# Patient Record
Sex: Female | Born: 1955 | Race: White | Hispanic: No | Marital: Married | Smoking: Never smoker
Health system: Southern US, Community
[De-identification: ages and names within clinical notes are randomized; demographics above are authoritative.]

## PROBLEM LIST (undated history)

## (undated) DIAGNOSIS — I10 Essential (primary) hypertension: Secondary | ICD-10-CM

## (undated) DIAGNOSIS — E7800 Pure hypercholesterolemia, unspecified: Secondary | ICD-10-CM

## (undated) HISTORY — PX: TONSILLECTOMY: SUR1361

## (undated) HISTORY — PX: DILITATION & CURRETTAGE/HYSTROSCOPY WITH ESSURE: SHX5573

## (undated) HISTORY — PX: EYE SURGERY: SHX253

---

## 2002-07-14 ENCOUNTER — Other Ambulatory Visit: Admission: RE | Admit: 2002-07-14 | Discharge: 2002-07-14 | Payer: Self-pay | Admitting: Obstetrics and Gynecology

## 2002-07-18 ENCOUNTER — Encounter: Payer: Self-pay | Admitting: Obstetrics and Gynecology

## 2002-07-18 ENCOUNTER — Encounter: Admission: RE | Admit: 2002-07-18 | Discharge: 2002-07-18 | Payer: Self-pay | Admitting: Obstetrics and Gynecology

## 2003-07-24 ENCOUNTER — Other Ambulatory Visit: Admission: RE | Admit: 2003-07-24 | Discharge: 2003-07-24 | Payer: Self-pay | Admitting: Obstetrics and Gynecology

## 2004-02-08 ENCOUNTER — Encounter: Admission: RE | Admit: 2004-02-08 | Discharge: 2004-02-08 | Payer: Self-pay | Admitting: Internal Medicine

## 2006-04-12 ENCOUNTER — Encounter: Admission: RE | Admit: 2006-04-12 | Discharge: 2006-04-12 | Payer: Self-pay | Admitting: Obstetrics and Gynecology

## 2006-04-25 ENCOUNTER — Encounter: Admission: RE | Admit: 2006-04-25 | Discharge: 2006-04-25 | Payer: Self-pay | Admitting: Obstetrics and Gynecology

## 2006-04-30 ENCOUNTER — Other Ambulatory Visit: Admission: RE | Admit: 2006-04-30 | Discharge: 2006-04-30 | Payer: Self-pay | Admitting: Obstetrics & Gynecology

## 2008-09-17 ENCOUNTER — Other Ambulatory Visit: Admission: RE | Admit: 2008-09-17 | Discharge: 2008-09-17 | Payer: Self-pay | Admitting: Obstetrics and Gynecology

## 2008-09-22 ENCOUNTER — Encounter: Admission: RE | Admit: 2008-09-22 | Discharge: 2008-09-22 | Payer: Self-pay | Admitting: Obstetrics and Gynecology

## 2008-09-28 ENCOUNTER — Encounter: Admission: RE | Admit: 2008-09-28 | Discharge: 2008-09-28 | Payer: Self-pay | Admitting: Family Medicine

## 2008-10-08 ENCOUNTER — Ambulatory Visit: Payer: Self-pay

## 2008-10-08 ENCOUNTER — Encounter: Payer: Self-pay | Admitting: Internal Medicine

## 2008-10-26 ENCOUNTER — Ambulatory Visit: Payer: Self-pay

## 2008-11-10 ENCOUNTER — Encounter: Admission: RE | Admit: 2008-11-10 | Discharge: 2008-11-10 | Payer: Self-pay | Admitting: Obstetrics and Gynecology

## 2010-07-31 ENCOUNTER — Encounter: Payer: Self-pay | Admitting: Obstetrics and Gynecology

## 2010-07-31 ENCOUNTER — Encounter: Payer: Self-pay | Admitting: Family Medicine

## 2010-08-01 ENCOUNTER — Encounter: Payer: Self-pay | Admitting: Obstetrics and Gynecology

## 2011-01-01 IMAGING — MG MM DIAGNOSTIC LTD LEFT
4 series · 4 of 4 positions shown · non-contrast
Comparison: Prior studies

CLINICAL DATA: Abnormal screening mammogram.

DIGITAL DIAGNOSTIC LEFT BREAST MAMMOGRAM

[L CC (1 of 2)]
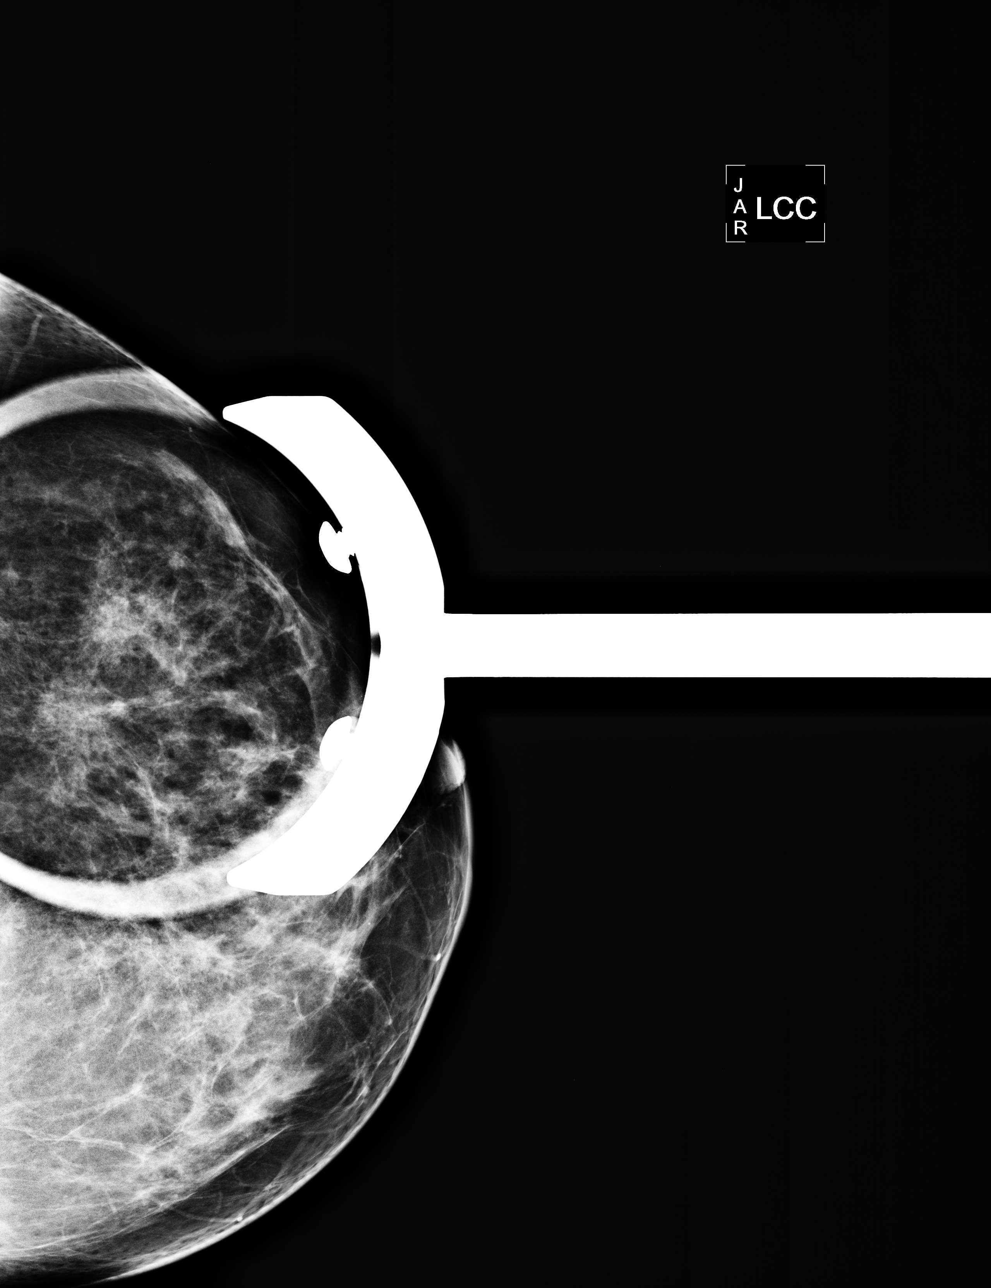

[L MLO (1 of 2)]
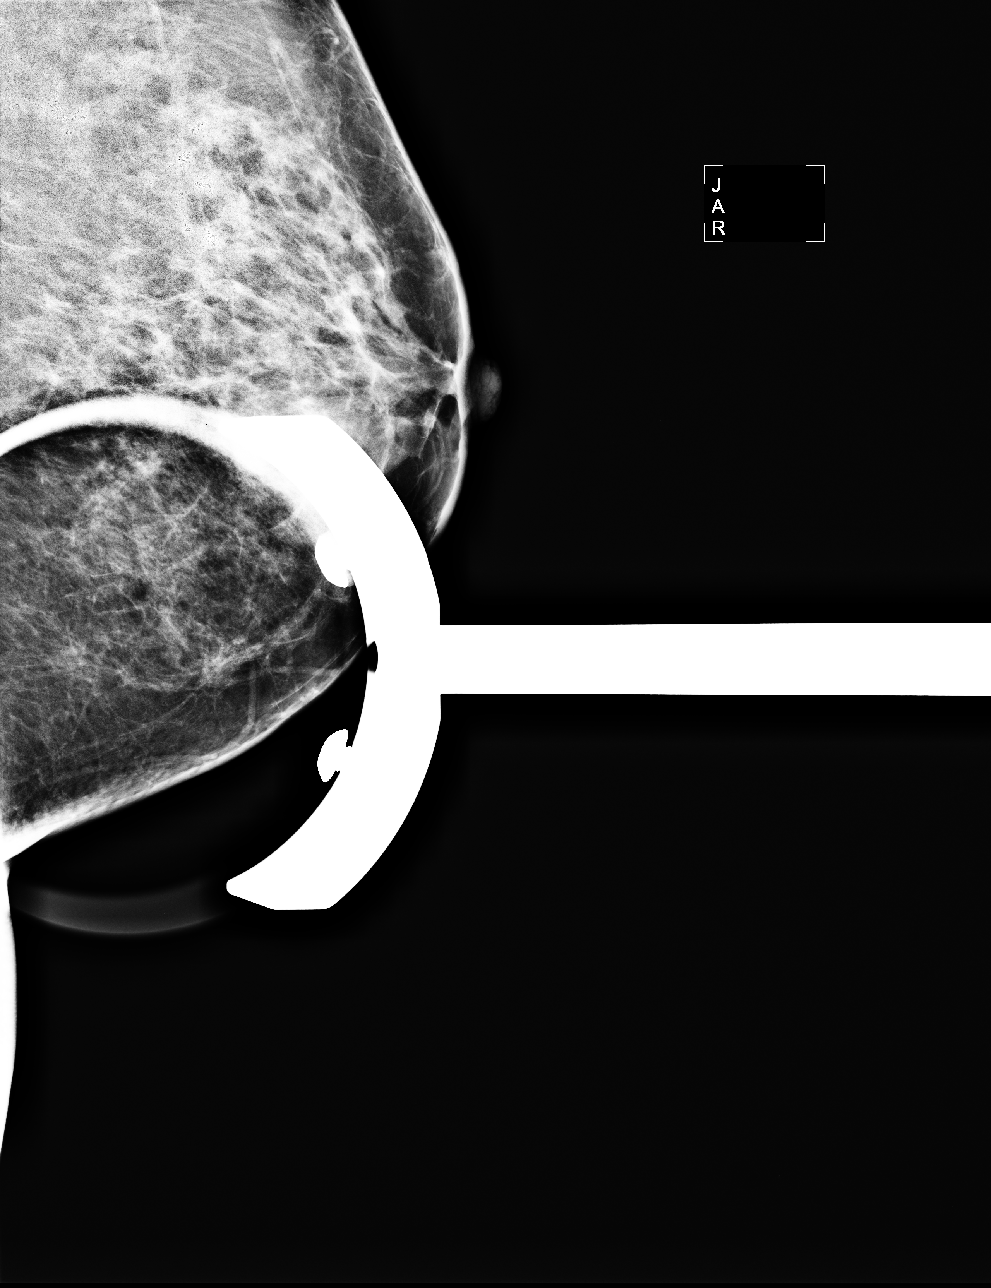

[L CC (2 of 2)]
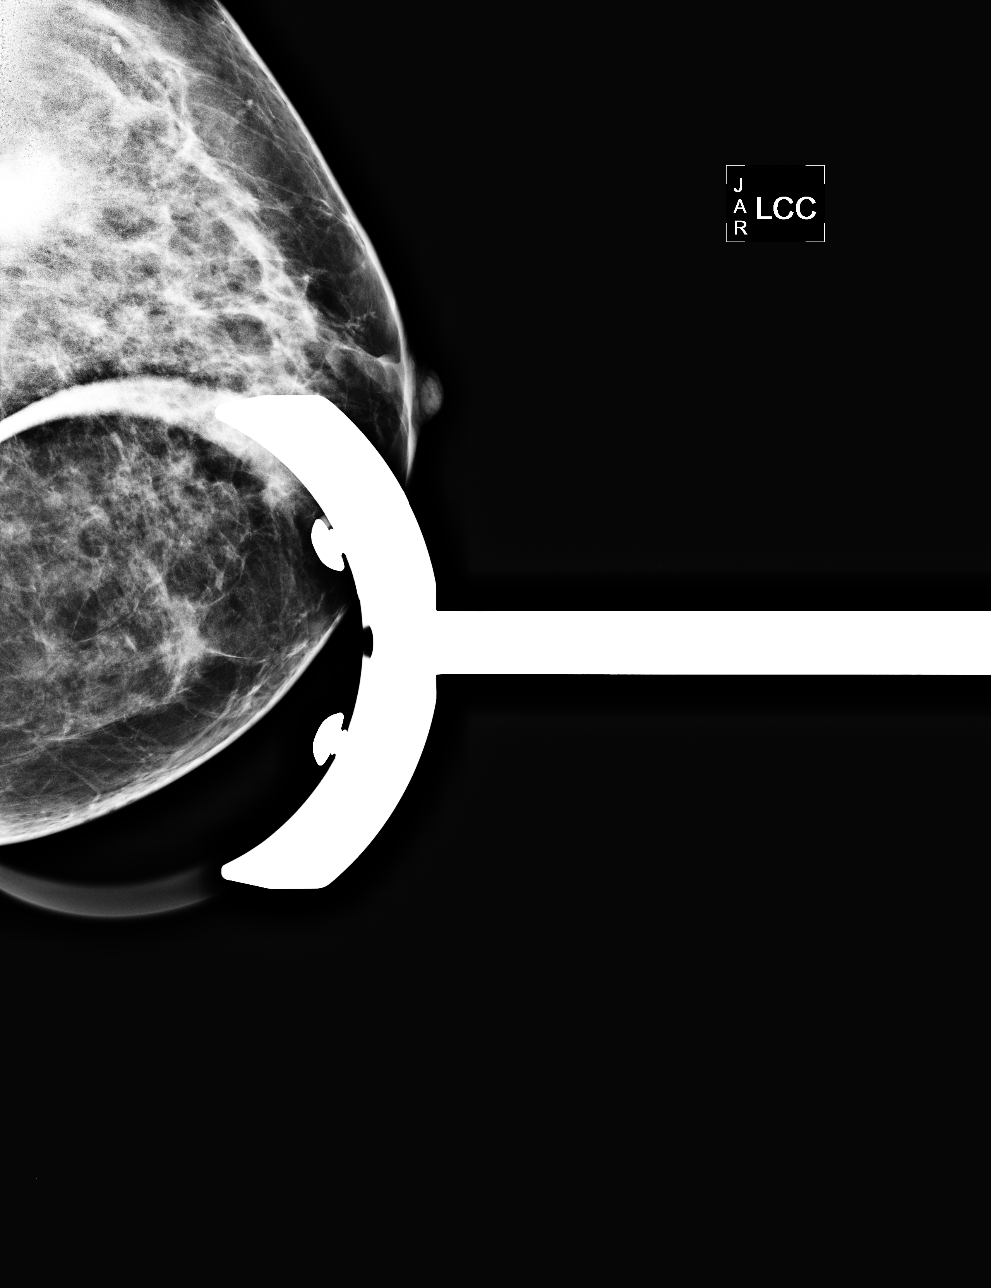

[L MLO (2 of 2)]
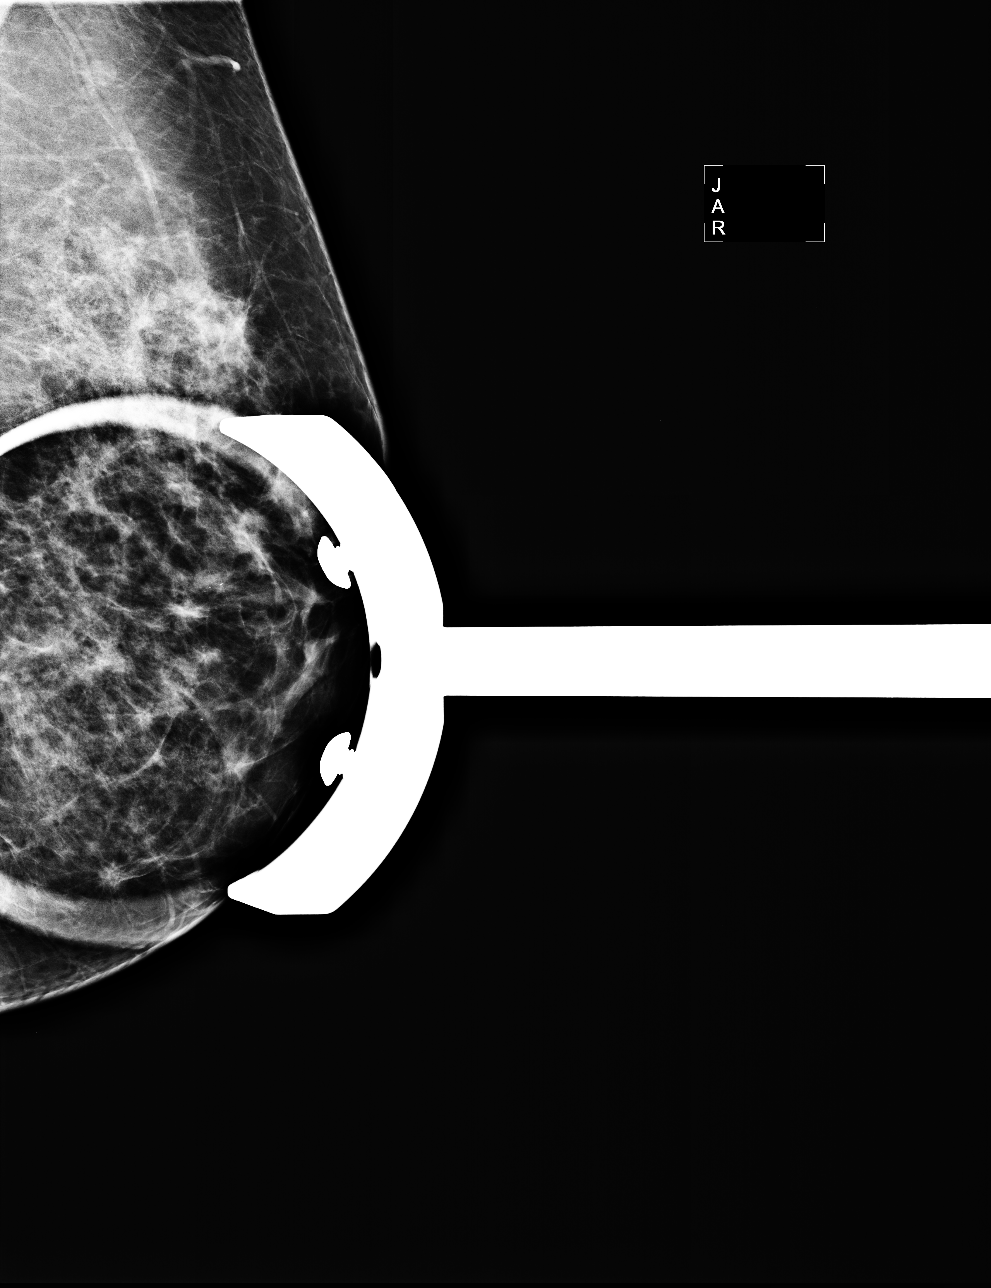

[4 of 4 positions shown; findings below may reference images not displayed]

FINDINGS: Spot compression views of the left breast were
performed.  The areas of questionable developing density within the
left breast do not persist and are consistent with summation
shadows.  There is no worrisome lesion on further evaluation of the
left breast.
IMPRESSION: No persistent abnormality on spot compression views of the left
breast.  The appearance is most consistent with summation shadows.
Recommend return to screening mammography in 1 year.

BI-RADS CATEGORY 1:  Negative.

## 2016-07-04 ENCOUNTER — Encounter (HOSPITAL_BASED_OUTPATIENT_CLINIC_OR_DEPARTMENT_OTHER): Payer: Self-pay | Admitting: *Deleted

## 2016-07-04 ENCOUNTER — Emergency Department (HOSPITAL_BASED_OUTPATIENT_CLINIC_OR_DEPARTMENT_OTHER)
Admission: EM | Admit: 2016-07-04 | Discharge: 2016-07-04 | Disposition: A | Discharge status: Home / Self Care | Payer: BLUE CROSS/BLUE SHIELD | Attending: Emergency Medicine | Admitting: Emergency Medicine

## 2016-07-04 DIAGNOSIS — R112 Nausea with vomiting, unspecified: Secondary | ICD-10-CM | POA: Diagnosis not present

## 2016-07-04 DIAGNOSIS — R197 Diarrhea, unspecified: Secondary | ICD-10-CM | POA: Diagnosis not present

## 2016-07-04 DIAGNOSIS — I10 Essential (primary) hypertension: Secondary | ICD-10-CM | POA: Diagnosis not present

## 2016-07-04 HISTORY — DX: Pure hypercholesterolemia, unspecified: E78.00

## 2016-07-04 HISTORY — DX: Essential (primary) hypertension: I10

## 2016-07-04 LAB — URINALYSIS, ROUTINE W REFLEX MICROSCOPIC
BILIRUBIN URINE: NEGATIVE
Glucose, UA: NEGATIVE mg/dL
HGB URINE DIPSTICK: NEGATIVE
KETONES UR: 15 mg/dL — AB
Nitrite: NEGATIVE
PROTEIN: 100 mg/dL — AB
Specific Gravity, Urine: 1.028 (ref 1.005–1.030)
pH: 7 (ref 5.0–8.0)

## 2016-07-04 LAB — CBC
HCT: 44.7 % (ref 36.0–46.0)
Hemoglobin: 15.1 g/dL — ABNORMAL HIGH (ref 12.0–15.0)
MCH: 28.4 pg (ref 26.0–34.0)
MCHC: 33.8 g/dL (ref 30.0–36.0)
MCV: 84.2 fL (ref 78.0–100.0)
PLATELETS: 195 10*3/uL (ref 150–400)
RBC: 5.31 MIL/uL — AB (ref 3.87–5.11)
RDW: 12.8 % (ref 11.5–15.5)
WBC: 11.4 10*3/uL — ABNORMAL HIGH (ref 4.0–10.5)

## 2016-07-04 LAB — BASIC METABOLIC PANEL
Anion gap: 10 (ref 5–15)
BUN: 19 mg/dL (ref 6–20)
CALCIUM: 9.7 mg/dL (ref 8.9–10.3)
CO2: 21 mmol/L — ABNORMAL LOW (ref 22–32)
CREATININE: 1.13 mg/dL — AB (ref 0.44–1.00)
Chloride: 107 mmol/L (ref 101–111)
GFR calc Af Amer: >60 mL/min (ref >60)
GFR, EST NON AFRICAN AMERICAN: 53 mL/min — AB (ref >60)
Glucose, Bld: 133 mg/dL — ABNORMAL HIGH (ref 65–99)
POTASSIUM: 4.4 mmol/L (ref 3.5–5.1)
SODIUM: 138 mmol/L (ref 135–145)

## 2016-07-04 LAB — URINALYSIS, MICROSCOPIC (REFLEX): RBC / HPF: NONE SEEN RBC/hpf (ref 0–5)

## 2016-07-04 MED ORDER — ONDANSETRON HCL 4 MG/2ML IJ SOLN
4.0000 mg | Freq: Once | INTRAMUSCULAR | Status: AC
  Administered 2016-07-04: 4 mg via INTRAVENOUS
  Filled 2016-07-04: qty 2

## 2016-07-04 MED ORDER — ONDANSETRON 4 MG PO TBDP: 4.0000 mg | ORAL_TABLET | Freq: Three times a day (TID) | ORAL | 0 refills | Status: AC | PRN

## 2016-07-04 MED ORDER — SODIUM CHLORIDE 0.9 % IV BOLUS (SEPSIS)
1000.0000 mL | Freq: Once | INTRAVENOUS | Status: AC
  Administered 2016-07-04: 1000 mL via INTRAVENOUS

## 2016-07-04 MED ORDER — KETOROLAC TROMETHAMINE 30 MG/ML IJ SOLN
30.0000 mg | Freq: Once | INTRAMUSCULAR | Status: AC
  Administered 2016-07-04: 30 mg via INTRAVENOUS
  Filled 2016-07-04: qty 1

## 2016-07-04 MED FILL — ONDANSETRON ODT 4 MG TABLET: 4 | 6 days supply | Qty: 20 | Fill #0

## 2016-07-04 NOTE — ED Notes (Signed)
Pt sts feeling much better; able to try po challenge. Sprite provided.

## 2016-07-04 NOTE — Discharge Instructions (Signed)
Return to the ED with any concerns including vomiting and not able to keep down liquids or your medications, abdominal pain especially if it localizes to the right lower abdomen, fever or chills, and decreased urine output, decreased level of alertness or lethargy, or any other alarming symptoms.  °

## 2016-07-04 NOTE — ED Notes (Signed)
Tolerated PO challenge.

## 2016-07-04 NOTE — ED Provider Notes (Signed)
[facility]   CSN: [id number] Arrival date & time: 07/04/16  [id number]     History   Chief Complaint Chief Complaint  Patient presents with  . Nausea  . Emesis  . Diarrhea    HPI Daisy Dixon is a 60 y.o. female.  HPI  Pt presenting with c/o nausea/vomiting/diarrhea.  She started having some diarrhea yesterday, then last night approx 230am vomiting and diarrhea returned.  She has had multiple episodes of both.  She has not been able to keep down fluids.  Emesis is nonbloody and nonbilious, diarrhea is without blood or mucous.  She visited her mother in nursing home 2 days ago- nursing patients including mother were having a GI illness at  That time.  No fever.  No focal abdomianl pain.  There are no other associated systemic symptoms, there are no other alleviating or modifying factors.   Past Medical History:  Diagnosis Date  . Hypercholesteremia   . Hypertension     There are no active problems to display for this patient.   Past Surgical History:  Procedure Laterality Date  . DILITATION & CURRETTAGE/HYSTROSCOPY WITH ESSURE    . EYE SURGERY    . TONSILLECTOMY      OB History    No data available       Home Medications    Prior to Admission medications   Medication Sig Start Date End Date Taking? Authorizing Provider  ondansetron (ZOFRAN ODT) 4 MG disintegrating tablet Take 1 tablet (4 mg total) by mouth every 8 (eight) hours as needed for nausea or vomiting. 07/04/16   Jerelyn ScottMartha Linker, MD    Family History No family history on file.  Social History Social History  Substance Use Topics  . Smoking status: Never Smoker  . Smokeless tobacco: Never Used  . Alcohol use Yes     Comment: rarely     Allergies   Patient has no known allergies.   Review of Systems Review of Systems  ROS reviewed and all otherwise negative except for mentioned in HPI   Physical Exam Updated Vital Signs BP 141/78   Pulse 89   Temp 97.5 F (36.4  C) (Oral)   Resp 16   Ht 5\' 6"  (1.676 m)   Wt 152 lb (68.9 kg)   SpO2 96%   BMI 24.53 kg/m  Vitals reviewed Physical Exam Physical Examination: General appearance - alert, well appearing, and in no distress Mental status - alert, oriented to person, place, and time Eyes - no conjunctival injection, no scleral icterus Mouth - mucous membranes dry, OP clear Neck - supple, no significant adenopathy Chest - clear to auscultation, no wheezes, rales or rhonchi, symmetric air entry Heart - normal rate, regular rhythm, normal S1, S2, no murmurs, rubs, clicks or gallops Abdomen - soft, nontender, nondistended, no masses or organomegaly Neurological - alert, oriented, normal speech, no focal findings or movement disorder noted Extremities - peripheral pulses normal, no pedal edema, no clubbing or cyanosis Skin - normal coloration and turgor, no rashes  ED Treatments / Results  Labs (all labs ordered are listed, but only abnormal results are displayed) Labs Reviewed  CBC - Abnormal; Notable for the following:       Result Value   WBC 11.4 (*)    RBC 5.31 (*)    Hemoglobin 15.1 (*)    All other components within normal limits  BASIC METABOLIC PANEL - Abnormal; Notable for the following:    CO2 21 (*)  Glucose, Bld 133 (*)    Creatinine, Ser 1.13 (*)    GFR calc non Af Amer 53 (*)    All other components within normal limits  URINALYSIS, ROUTINE W REFLEX MICROSCOPIC - Abnormal; Notable for the following:    Color, Urine AMBER (*)    APPearance CLOUDY (*)    Ketones, ur 15 (*)    Protein, ur 100 (*)    Leukocytes, UA TRACE (*)    All other components within normal limits  URINALYSIS, MICROSCOPIC (REFLEX) - Abnormal; Notable for the following:    Bacteria, UA RARE (*)    Squamous Epithelial / LPF 0-5 (*)    All other components within normal limits    EKG  EKG Interpretation  Date/Time:  Tuesday July 04 2016 07:36:44 EST Ventricular Rate:  80 PR Interval:    QRS  Duration: 78 QT Interval:  401 QTC Calculation: 463 R Axis:   73 Text Interpretation:  Sinus rhythm Low voltage, precordial leads No old tracing to compare Confirmed by Karma GanjaLINKER  MD, Nana Vastine [phone]([id number]) on 07/04/2016 7:43:38 AM Also confirmed by Karma GanjaLINKER  MD, Yarnell Arvidson [phone]), editor Whitney PostLOGAN, Cala BradfordKIMBERLY [phone]([id number])  on 07/04/2016 8:01:44 AM       Radiology No results found.  Procedures Procedures (including critical care time)  Medications Ordered in ED Medications  sodium chloride 0.9 % bolus 1,000 mL (0 mLs Intravenous Stopped 07/04/16 0800)  ondansetron (ZOFRAN) injection 4 mg (4 mg Intravenous Given 07/04/16 0722)  sodium chloride 0.9 % bolus 1,000 mL (0 mLs Intravenous Stopped 07/04/16 1005)  ondansetron (ZOFRAN) injection 4 mg (4 mg Intravenous Given 07/04/16 0833)  ketorolac (TORADOL) 30 MG/ML injection 30 mg (30 mg Intravenous Given 07/04/16 0831)     Initial Impression / Assessment and Plan / ED Course  I have reviewed the triage vital signs and the nursing notes.  Pertinent labs & imaging results that were available during my care of the patient were reviewed by me and considered in my medical decision making (see chart for details).  Clinical Course    9:16 AM after 2nd dose of zofran patient is feeling much improved.  She is smiling states her nausea is gone.  Will do PO trial.   Pt presenting with nausea/vomiting/diarrhea after contact with NH patient with similar symptoms.  Her abdominal exam is benign- she appears somewhat dehydrated- pt feels improved after treatment with IV fluids, zofran, toradol.  She is able to tolerate po fluids in the ED.  Labs are c/w this diagnosis.  Most likley viral infection.  Discharged with strict return precautions.  Pt agreeable with plan.  Final Clinical Impressions(s) / ED Diagnoses   Final diagnoses:  Nausea vomiting and diarrhea    New Prescriptions Discharge Medication List as of 07/04/2016  9:58 AM    START taking these medications    Details  ondansetron (ZOFRAN ODT) 4 MG disintegrating tablet Take 1 tablet (4 mg total) by mouth every 8 (eight) hours as needed for nausea or vomiting., Starting Tue 07/04/2016, Print         Jerelyn ScottMartha Linker, MD 07/04/16 1202

## 2016-07-04 NOTE — ED Notes (Signed)
Patient ambulated to the BR stating she was feeling light headed.  Assist times 2.

## 2016-07-04 NOTE — ED Triage Notes (Signed)
Patient states she was visiting her Mother in a nursing home and they have the GI issues going around.  Patient started with diarrhea yesterday about 230am and started with frequent vomiting this AM.

## 2016-07-04 NOTE — ED Notes (Signed)
Dr. Karma GanjaLinker notified of pt's c/o chest pain. VORB for EKG

## 2016-07-04 NOTE — ED Notes (Signed)
NS infusing, but slowly; IV tubing noted to be folded over and clamped on itself; tubing repositioned and fluids now flowing quickly.

## 2019-03-05 ENCOUNTER — Other Ambulatory Visit: Payer: Self-pay | Admitting: Nurse Practitioner

## 2019-03-05 DIAGNOSIS — Z1231 Encounter for screening mammogram for malignant neoplasm of breast: Secondary | ICD-10-CM

## 2022-02-08 ENCOUNTER — Other Ambulatory Visit (HOSPITAL_BASED_OUTPATIENT_CLINIC_OR_DEPARTMENT_OTHER): Payer: Self-pay | Admitting: Student

## 2022-02-08 DIAGNOSIS — N289 Disorder of kidney and ureter, unspecified: Secondary | ICD-10-CM

## 2022-02-08 DIAGNOSIS — R945 Abnormal results of liver function studies: Secondary | ICD-10-CM

## 2022-02-09 ENCOUNTER — Ambulatory Visit (HOSPITAL_BASED_OUTPATIENT_CLINIC_OR_DEPARTMENT_OTHER)
Admission: RE | Admit: 2022-02-09 | Discharge: 2022-02-09 | Disposition: A | Discharge status: Home / Self Care | Payer: BC Managed Care – PPO | Source: Ambulatory Visit | Attending: Student | Admitting: Student

## 2022-02-09 DIAGNOSIS — N289 Disorder of kidney and ureter, unspecified: Secondary | ICD-10-CM | POA: Insufficient documentation

## 2022-02-09 DIAGNOSIS — R945 Abnormal results of liver function studies: Secondary | ICD-10-CM | POA: Insufficient documentation

## 2022-02-21 ENCOUNTER — Other Ambulatory Visit: Payer: Self-pay | Admitting: Obstetrics and Gynecology

## 2022-02-21 DIAGNOSIS — R928 Other abnormal and inconclusive findings on diagnostic imaging of breast: Secondary | ICD-10-CM

## 2022-02-24 ENCOUNTER — Ambulatory Visit
Admission: RE | Admit: 2022-02-24 | Discharge: 2022-02-24 | Disposition: A | Discharge status: Home / Self Care | Payer: BC Managed Care – PPO | Source: Ambulatory Visit | Attending: Obstetrics and Gynecology | Admitting: Obstetrics and Gynecology

## 2022-02-24 ENCOUNTER — Ambulatory Visit: Payer: BC Managed Care – PPO

## 2022-02-24 DIAGNOSIS — R928 Other abnormal and inconclusive findings on diagnostic imaging of breast: Secondary | ICD-10-CM

## 2023-04-02 ENCOUNTER — Other Ambulatory Visit: Payer: Self-pay | Admitting: Obstetrics and Gynecology

## 2023-04-02 DIAGNOSIS — R928 Other abnormal and inconclusive findings on diagnostic imaging of breast: Secondary | ICD-10-CM

## 2023-04-11 ENCOUNTER — Ambulatory Visit
Admission: RE | Admit: 2023-04-11 | Discharge: 2023-04-11 | Disposition: A | Discharge status: Home / Self Care | Payer: BC Managed Care – PPO | Source: Ambulatory Visit | Attending: Obstetrics and Gynecology | Admitting: Obstetrics and Gynecology

## 2023-04-11 ENCOUNTER — Ambulatory Visit: Payer: BC Managed Care – PPO

## 2023-04-11 DIAGNOSIS — R928 Other abnormal and inconclusive findings on diagnostic imaging of breast: Secondary | ICD-10-CM

## 2024-01-10 ENCOUNTER — Other Ambulatory Visit (HOSPITAL_BASED_OUTPATIENT_CLINIC_OR_DEPARTMENT_OTHER): Payer: Self-pay | Admitting: Family Medicine

## 2024-01-10 DIAGNOSIS — R110 Nausea: Secondary | ICD-10-CM

## 2024-01-25 ENCOUNTER — Ambulatory Visit (HOSPITAL_BASED_OUTPATIENT_CLINIC_OR_DEPARTMENT_OTHER)
Admission: RE | Admit: 2024-01-25 | Discharge: 2024-01-25 | Disposition: A | Discharge status: Home / Self Care | Payer: Self-pay | Source: Ambulatory Visit | Attending: Family Medicine | Admitting: Family Medicine

## 2024-01-25 DIAGNOSIS — R110 Nausea: Secondary | ICD-10-CM | POA: Diagnosis present

## 2024-02-12 DIAGNOSIS — G4719 Other hypersomnia: Secondary | ICD-10-CM | POA: Diagnosis not present

## 2024-02-12 DIAGNOSIS — J988 Other specified respiratory disorders: Secondary | ICD-10-CM | POA: Diagnosis not present

## 2024-02-12 DIAGNOSIS — G2581 Restless legs syndrome: Secondary | ICD-10-CM | POA: Diagnosis not present

## 2024-02-12 DIAGNOSIS — R0683 Snoring: Secondary | ICD-10-CM | POA: Diagnosis not present

## 2024-02-12 DIAGNOSIS — F329 Major depressive disorder, single episode, unspecified: Secondary | ICD-10-CM | POA: Diagnosis not present

## 2024-02-12 DIAGNOSIS — G478 Other sleep disorders: Secondary | ICD-10-CM | POA: Diagnosis not present

## 2024-03-28 DIAGNOSIS — L292 Pruritus vulvae: Secondary | ICD-10-CM | POA: Diagnosis not present

## 2024-03-28 DIAGNOSIS — Z01419 Encounter for gynecological examination (general) (routine) without abnormal findings: Secondary | ICD-10-CM | POA: Diagnosis not present

## 2024-03-28 DIAGNOSIS — Z6829 Body mass index (BMI) 29.0-29.9, adult: Secondary | ICD-10-CM | POA: Diagnosis not present
